# Patient Record
Sex: Female | Born: 2000 | Race: Black or African American | Hispanic: No | Marital: Single | Smoking: Never smoker
Health system: Southern US, Community
[De-identification: ages and names within clinical notes are randomized; demographics above are authoritative.]

## PROBLEM LIST (undated history)

## (undated) HISTORY — PX: APPENDECTOMY: SHX54

---
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
## 2020-03-01 ENCOUNTER — Ambulatory Visit: Payer: BC Managed Care – PPO | Attending: Internal Medicine

## 2020-03-01 DIAGNOSIS — Z23 Encounter for immunization: Secondary | ICD-10-CM

## 2020-03-01 NOTE — Progress Notes (Signed)
   Covid-19 Vaccination Clinic  Name:  Bedelia Pong    MRN: [id number] DOB: 06-02-2001  03/01/2020  Ms. Dicarlo was observed post Covid-19 immunization for 15 minutes without incident. She was provided with Vaccine Information Sheet and instruction to access the V-Safe system.   Ms. Adami was instructed to call 911 with any severe reactions post vaccine: Marland Kitchen Difficulty breathing  . Swelling of face and throat  . A fast heartbeat  . A bad rash all over body  . Dizziness and weakness   Immunizations Administered    Name Date Dose VIS Date Route   Pfizer COVID-19 Vaccine 03/01/2020  1:00 PM 0.3 mL 11/04/2019 Intramuscular   Manufacturer: [employer], [employer]   Lot: [id number]   NDC: [address]-2

## 2020-03-27 ENCOUNTER — Ambulatory Visit: Payer: BC Managed Care – PPO | Attending: Internal Medicine

## 2020-03-27 DIAGNOSIS — Z23 Encounter for immunization: Secondary | ICD-10-CM

## 2020-03-27 NOTE — Progress Notes (Signed)
   Covid-19 Vaccination Clinic  Name:  Jenny Manning    MRN: [id number] DOB: 08-27-2001  03/27/2020  [name] was observed post Covid-19 immunization for 15 minutes without incident. She was provided with Vaccine Information Sheet and instruction to access the V-Safe system.   [name] was instructed to call 911 with any severe reactions post vaccine: Marland Kitchen Difficulty breathing  . Swelling of face and throat  . A fast heartbeat  . A bad rash all over body  . Dizziness and weakness   Immunizations Administered    Name Date Dose VIS Date Route   Pfizer COVID-19 Vaccine 03/27/2020 10:10 AM 0.3 mL 01/18/2019 Intramuscular   Manufacturer: [employer], [employer]   Lot: [id number]   NDC: [address]-9

## 2020-06-12 IMAGING — CR DG ABDOMEN 1V
1 series · 1 of 1 positions shown · non-contrast
Comparison: None.

CLINICAL DATA: Epigastric abdominal pain and vomiting

EXAM:
ABDOMEN - 1 VIEW

[abdomen kub]
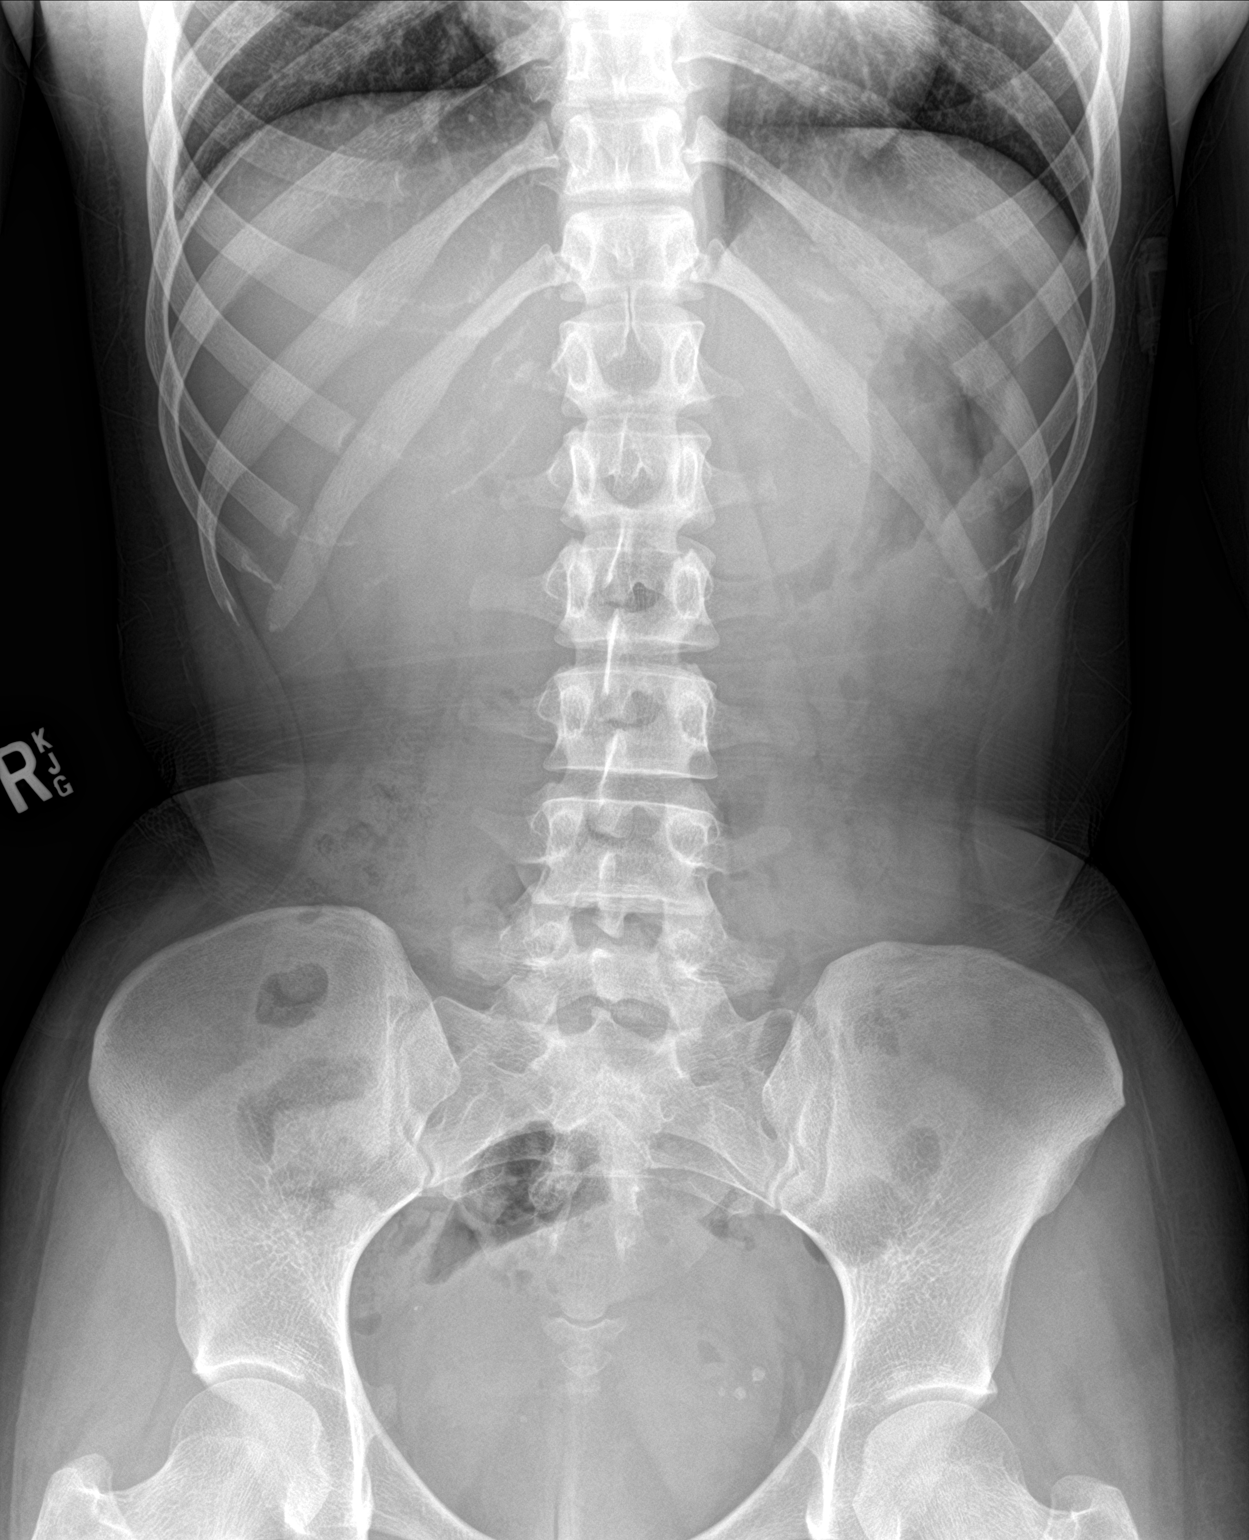

[1 of 1 positions shown; findings below may reference images not displayed]

FINDINGS: Normal bowel gas pattern. No concerning mass effect or gas
collection when accounting for partially distended stomach and
bladder shadow. Pelvic calcifications are compatible with
phleboliths in this clinical setting. No osseous findings or lung
base opacification
IMPRESSION: Negative.

## 2023-07-16 ENCOUNTER — Ambulatory Visit (HOSPITAL_COMMUNITY)
Admission: EM | Admit: 2023-07-16 | Discharge: 2023-07-16 | Disposition: A | Discharge status: Home / Self Care | Payer: BC Managed Care – PPO | Attending: Internal Medicine | Admitting: Internal Medicine

## 2023-07-16 ENCOUNTER — Encounter (HOSPITAL_COMMUNITY): Payer: Self-pay

## 2023-07-16 DIAGNOSIS — Z9189 Other specified personal risk factors, not elsewhere classified: Secondary | ICD-10-CM

## 2023-07-16 DIAGNOSIS — Z202 Contact with and (suspected) exposure to infections with a predominantly sexual mode of transmission: Secondary | ICD-10-CM | POA: Insufficient documentation

## 2023-07-16 LAB — HIV ANTIBODY (ROUTINE TESTING W REFLEX): HIV Screen 4th Generation wRfx: NONREACTIVE

## 2023-07-16 NOTE — Discharge Instructions (Signed)
 STD testing pending, this will take 2-3 days to result. We will only call you if your testing is positive for any infection(s) and we will provide treatment.  Avoid sexual intercourse until your STD results come back.  If any of your STD results are positive, you will need to avoid sexual intercourse for 7 days while you are being treated to prevent spread of STD.  Condom use is the best way to prevent spread of STDs.  Return to urgent care as needed.

## 2023-07-16 NOTE — ED Provider Notes (Signed)
[facility]    CSN: [id number] Arrival date & time: 07/16/23  1739      History   Chief Complaint Chief Complaint  Patient presents with   SEXUALLY TRANSMITTED DISEASE    HPI Jenny Manning is a 22 y.o. female.   Patient presents to urgent care for STD testing after her partner "tested positive for nongonococcal urethritis". She is not having any vaginal discharge, odor, or itch. No urinary symptoms, N/V/D, abdominal pain, or dizziness. No known exposures to STD. No other recent new sexual partners.      History reviewed. No pertinent past medical history.  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      Home Medications    Prior to Admission medications   Medication Sig Start Date End Date Taking? Authorizing Provider  Adapalene-Benzoyl Peroxide (EPIDUO FORTE) 0.3-2.5 % GEL Apply 1 application topically at bedtime. 12/01/17   [provider]  drospirenone-ethinyl estradiol (YAZ) 3-0.02 MG tablet Take 1 tablet by mouth at bedtime. 03/24/19   [provider]  ondansetron (ZOFRAN ODT) 4 MG disintegrating tablet Take 1 tablet (4 mg total) by mouth 4 (four) times daily as needed for nausea or vomiting. 08/26/19   Calvert Cantor, MD    Family History History reviewed. No pertinent family history.  Social History Social History   Tobacco Use   Smoking status: Never   Smokeless tobacco: Never  Vaping Use   Vaping status: Never Used  Substance Use Topics   Alcohol use: Yes    Comment: socially   Drug use: Never     Allergies   Patient has no known allergies.   Review of Systems Review of Systems Per HPI  Physical Exam Triage Vital Signs ED Triage Vitals  Encounter Vitals Group     BP 07/16/23 1754  113/74     Systolic BP Percentile --      Diastolic BP Percentile --      Pulse Rate 07/16/23 1754 82     Resp 07/16/23 1754 16     Temp 07/16/23 1754 98.1 F (36.7 C)     Temp Source 07/16/23 1754 Oral     SpO2 07/16/23 1754 98 %     Weight 07/16/23 1754 135 lb (61.2 kg)     Height 07/16/23 1754 5\' 3"  (1.6 m)     Head Circumference --      Peak Flow --      Pain Score 07/16/23 1753 0     Pain Loc --      Pain Education --      Exclude from Growth Chart --    No data found.  Updated Vital Signs BP 113/74 (BP Location: Right Arm)   Pulse 82   Temp 98.1 F (36.7 C) (Oral)   Resp 16   Ht 5\' 3"  (1.6 m)   Wt 135 lb (61.2 kg)   LMP 06/25/2023 (Approximate)   SpO2 98%   BMI 23.91 kg/m   Visual Acuity Right Eye Distance:   Left Eye Distance:   Bilateral Distance:    Right Eye Near:   Left Eye Near:    Bilateral Near:     Physical Exam Vitals and nursing note reviewed.  Constitutional:  Appearance: She is not ill-appearing or toxic-appearing.  HENT:     Head: Normocephalic and atraumatic.     Right Ear: Hearing and external ear normal.     Left Ear: Hearing and external ear normal.     Nose: Nose normal.     Mouth/Throat:     Lips: Pink.  Eyes:     General: Lids are normal. Vision grossly intact. Gaze aligned appropriately.     Extraocular Movements: Extraocular movements intact.     Conjunctiva/sclera: Conjunctivae normal.  Pulmonary:     Effort: Pulmonary effort is normal.  Genitourinary:    Comments: Deferred. Musculoskeletal:     Cervical back: Neck supple.  Skin:    General: Skin is warm and dry.     Capillary Refill: Capillary refill takes less than 2 seconds.     Findings: No rash.  Neurological:     General: No focal deficit present.     Mental Status: She is alert and oriented to person, place, and time. Mental status is at baseline.     Cranial Nerves: No dysarthria or facial asymmetry.  Psychiatric:        Mood and Affect: Mood normal.         Speech: Speech normal.        Behavior: Behavior normal.        Thought Content: Thought content normal.        Judgment: Judgment normal.      UC Treatments / Results  Labs (all labs ordered are listed, but only abnormal results are displayed) Labs Reviewed  HIV ANTIBODY (ROUTINE TESTING W REFLEX)  RPR  CERVICOVAGINAL ANCILLARY ONLY    EKG   Radiology No results found.  Procedures Procedures (including critical care time)  Medications Ordered in UC Medications - No data to display  Initial Impression / Assessment and Plan / UC Course  I have reviewed the triage vital signs and the nursing notes.  Pertinent labs & imaging results that were available during my care of the patient were reviewed by me and considered in my medical decision making (see chart for details).   1. At risk for sexually transmitted disease due to unprotected sex STI labs pending, will notify patient of positive results and treat accordingly per protocol when labs result.  Patient agrees to HIV and syphilis testing today.   Patient to avoid sexual intercourse until screening testing comes back.   Education provided regarding safe sexual practices and patient encouraged to use protection to prevent spread of STIs.   Counseled patient on potential for adverse effects with medications prescribed/recommended today, strict ER and return-to-clinic precautions discussed, patient verbalized understanding.    Final Clinical Impressions(s) / UC Diagnoses   Final diagnoses:  At risk for sexually transmitted disease due to unprotected sex     Discharge Instructions      STD testing pending, this will take 2-3 days to result. We will only call you if your testing is positive for any infection(s) and we will provide treatment.  Avoid sexual intercourse until your STD results come back.  If any of your STD results are positive, you will need to avoid sexual intercourse for 7 days while you are being  treated to prevent spread of STD.  Condom use is the best way to prevent spread of STDs.  Return to urgent care as needed.     ED Prescriptions   None    PDMP not reviewed this encounter.   Carlisle Beers,  FNP 07/16/23 1834

## 2023-07-16 NOTE — ED Triage Notes (Signed)
Patient here today to has STD testing. Her partner stated that he tested positive for NGU.

## 2023-07-17 LAB — CERVICOVAGINAL ANCILLARY ONLY
Bacterial Vaginitis (gardnerella): POSITIVE — AB
Candida Glabrata: NEGATIVE
Candida Vaginitis: NEGATIVE
Chlamydia: POSITIVE — AB
Comment: NEGATIVE
Comment: NEGATIVE
Comment: NEGATIVE
Comment: NEGATIVE
Comment: NEGATIVE
Comment: NORMAL
Neisseria Gonorrhea: NEGATIVE
Trichomonas: NEGATIVE

## 2023-07-17 LAB — RPR: RPR Ser Ql: NONREACTIVE

## 2023-07-20 ENCOUNTER — Telehealth (HOSPITAL_COMMUNITY): Payer: Self-pay | Admitting: Emergency Medicine

## 2023-07-20 MED ORDER — METRONIDAZOLE 500 MG PO TABS: 500.0000 mg | ORAL_TABLET | Freq: Two times a day (BID) | ORAL | 0 refills | Status: AC

## 2023-07-20 MED ORDER — DOXYCYCLINE HYCLATE 100 MG PO CAPS: 100.0000 mg | ORAL_CAPSULE | Freq: Two times a day (BID) | ORAL | 0 refills | Status: AC

## 2023-07-20 NOTE — Telephone Encounter (Signed)
Doxycycline for positive Chlamydia Metronidazole for positive BV
